# Patient Record
Sex: Female | Born: 1985 | Race: Black or African American | Hispanic: No | Marital: Single | Smoking: Never smoker
Health system: Southern US, Community
[De-identification: ages and names within clinical notes are randomized; demographics above are authoritative.]

## PROBLEM LIST (undated history)

## (undated) HISTORY — PX: EYE SURGERY: SHX253

---

## 2010-10-18 ENCOUNTER — Ambulatory Visit (HOSPITAL_COMMUNITY)
Admission: RE | Admit: 2010-10-18 | Discharge: 2010-10-18 | Disposition: A | Discharge status: Home / Self Care | Payer: BC Managed Care – PPO | Source: Ambulatory Visit | Attending: Emergency Medicine | Admitting: Emergency Medicine

## 2010-10-18 ENCOUNTER — Emergency Department (HOSPITAL_COMMUNITY)
Admission: EM | Admit: 2010-10-18 | Discharge: 2010-10-18 | Discharge status: Against Medical Advice | Payer: BC Managed Care – PPO | Attending: Emergency Medicine | Admitting: Emergency Medicine

## 2010-10-18 ENCOUNTER — Emergency Department (HOSPITAL_COMMUNITY): Payer: BC Managed Care – PPO

## 2010-10-18 ENCOUNTER — Encounter (HOSPITAL_COMMUNITY): Payer: Self-pay

## 2010-10-18 DIAGNOSIS — R072 Precordial pain: Secondary | ICD-10-CM | POA: Insufficient documentation

## 2010-10-18 DIAGNOSIS — R112 Nausea with vomiting, unspecified: Secondary | ICD-10-CM | POA: Insufficient documentation

## 2010-10-18 DIAGNOSIS — R0602 Shortness of breath: Secondary | ICD-10-CM | POA: Insufficient documentation

## 2010-10-18 LAB — DIFFERENTIAL
Lymphocytes Relative: 36 % (ref 12–46)
Lymphs Abs: 2.9 10*3/uL (ref 0.7–4.0)
Neutrophils Relative %: 57 % (ref 43–77)

## 2010-10-18 LAB — CBC
HCT: 29.3 % — ABNORMAL LOW (ref 36.0–46.0)
MCV: 73.1 fL — ABNORMAL LOW (ref 78.0–100.0)
Platelets: 314 10*3/uL (ref 150–400)
RBC: 4.01 MIL/uL (ref 3.87–5.11)
WBC: 8 10*3/uL (ref 4.0–10.5)

## 2010-10-18 LAB — COMPREHENSIVE METABOLIC PANEL
ALT: 10 U/L (ref 0–35)
Albumin: 3.3 g/dL — ABNORMAL LOW (ref 3.5–5.2)
Alkaline Phosphatase: 56 U/L (ref 39–117)
BUN: 12 mg/dL (ref 6–23)
Chloride: 102 mEq/L (ref 96–112)
Glucose, Bld: 106 mg/dL — ABNORMAL HIGH (ref 70–99)
Potassium: 3.7 mEq/L (ref 3.5–5.1)
Sodium: 137 mEq/L (ref 135–145)
Total Bilirubin: 0.1 mg/dL — ABNORMAL LOW (ref 0.3–1.2)

## 2010-10-18 LAB — RAPID URINE DRUG SCREEN, HOSP PERFORMED
Amphetamines: NOT DETECTED
Barbiturates: NOT DETECTED
Tetrahydrocannabinol: NOT DETECTED

## 2010-10-18 LAB — POCT PREGNANCY, URINE: Preg Test, Ur: NEGATIVE

## 2010-10-18 LAB — LIPASE, BLOOD: Lipase: 21 U/L (ref 11–59)

## 2010-10-18 MED ORDER — IOHEXOL 300 MG/ML  SOLN
100.0000 mL | Freq: Once | INTRAMUSCULAR | Status: AC | PRN
  Administered 2010-10-18: 100 mL via INTRAVENOUS

## 2013-02-12 IMAGING — CT CT ANGIO CHEST
2 of 7 series · 18 of 46 positions shown · IV contrast (APPLIED)
Comparison: Chest radiographs dated 10/18/2010

CLINICAL DATA: Vomiting, chest pain, elevated D-dimer, evaluate
for PE

CT ANGIOGRAPHY CHEST WITH CONTRAST
TECHNIQUE: Multidetector CT imaging of the chest was performed
using the standard protocol during bolus administration of
intravenous contrast.  Multiplanar CT image reconstructions
including MIPs were obtained to evaluate the vascular anatomy.
Contrast:  100 ml Wmnipaque-EMM IV

[Series 10: pulm embolism 1.0 b25f thin · axial · 0.71mm/px · z∈[-224,-15]mm · 15 of 239 slices shown]
[im 15/239  lung]
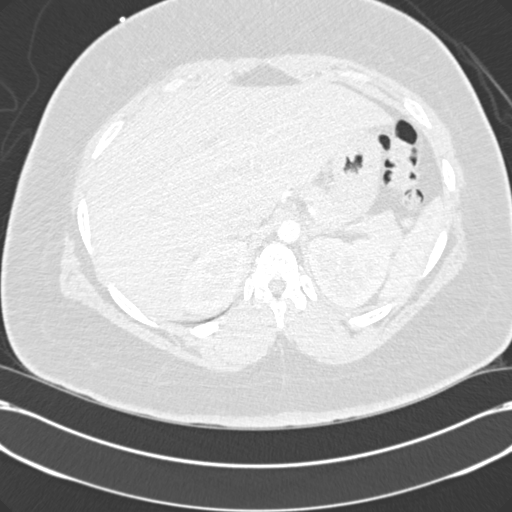
[im 30/239  soft-tissue]
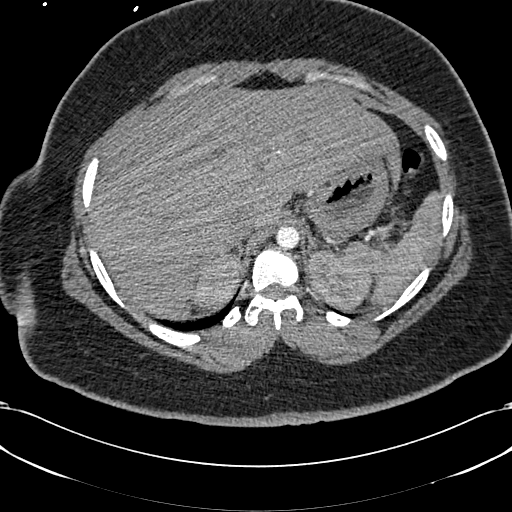
[im 45/239  lung]
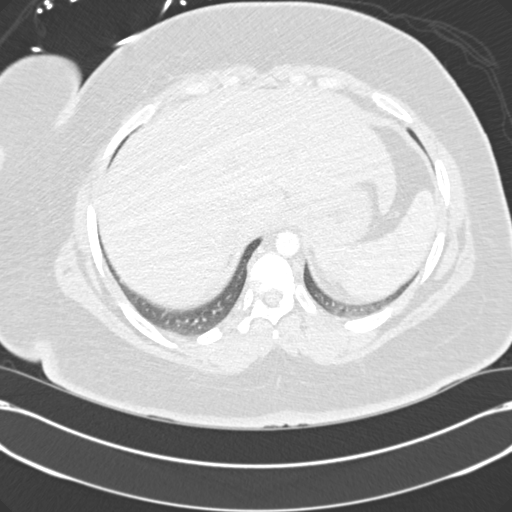
[im 60/239  soft-tissue]
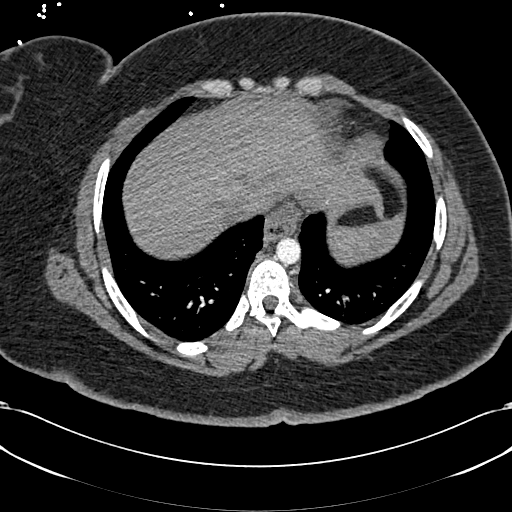
[im 75/239  lung]
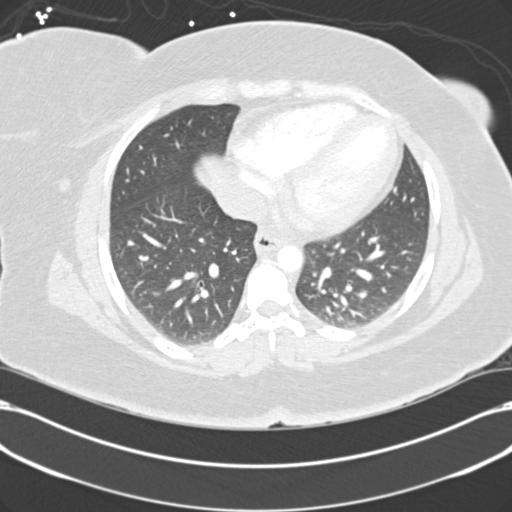
[im 90/239  soft-tissue]
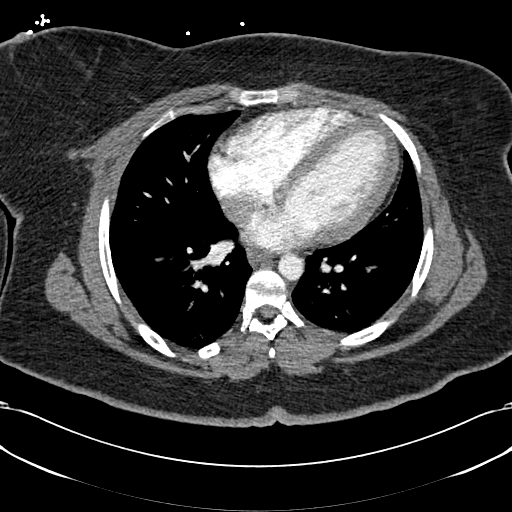
[im 105/239  lung]
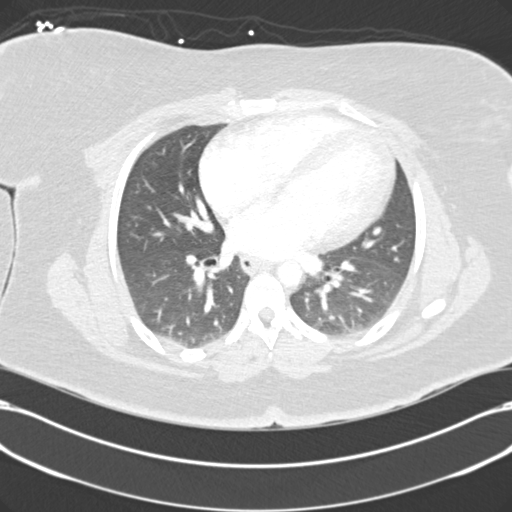
[im 120/239  soft-tissue]
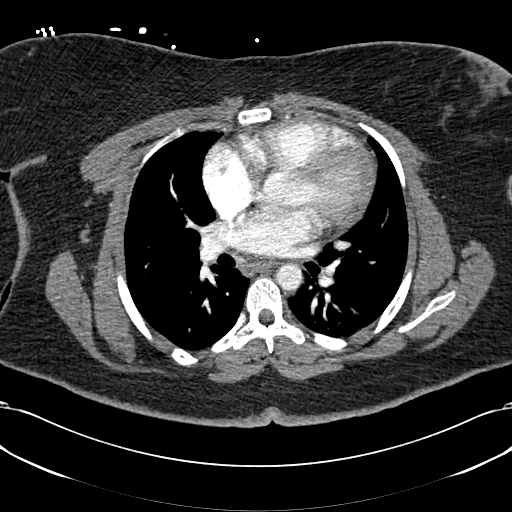
[im 134/239  lung]
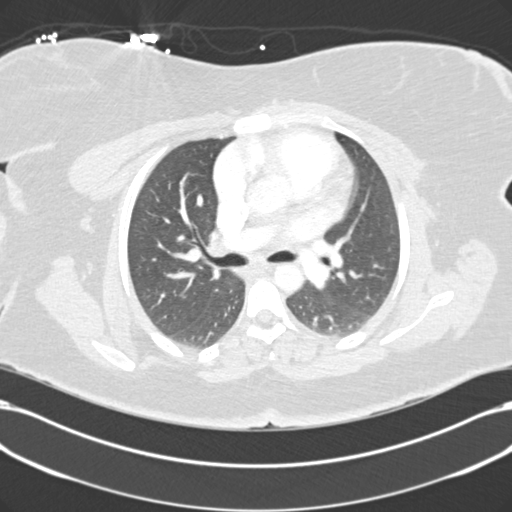
[im 149/239  soft-tissue]
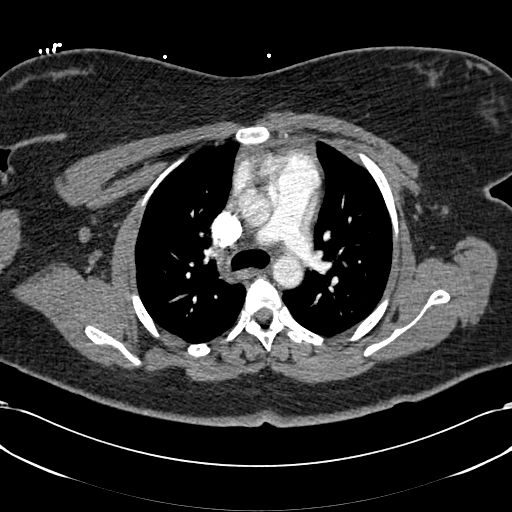
[im 164/239  lung]
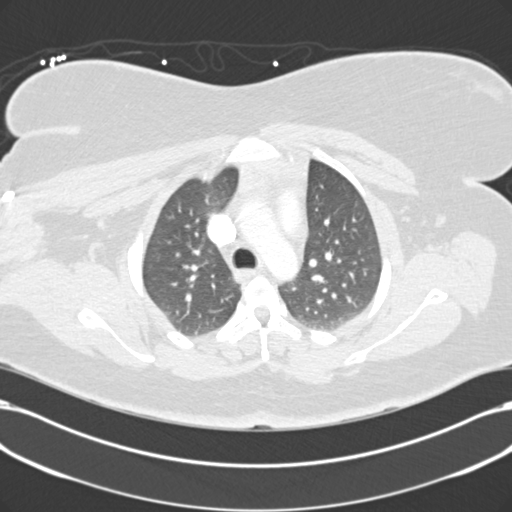
[im 179/239  soft-tissue]
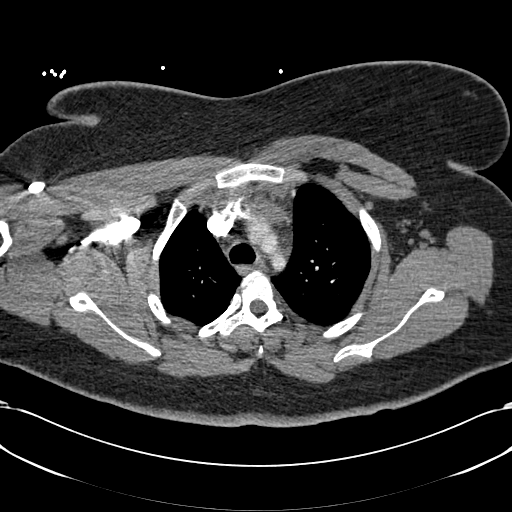
[im 194/239  lung]
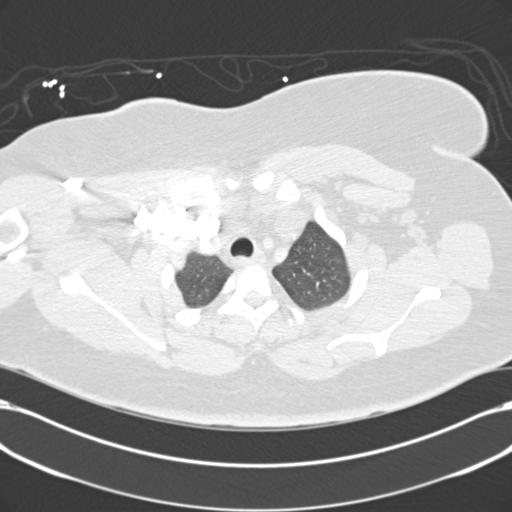
[im 209/239  soft-tissue]
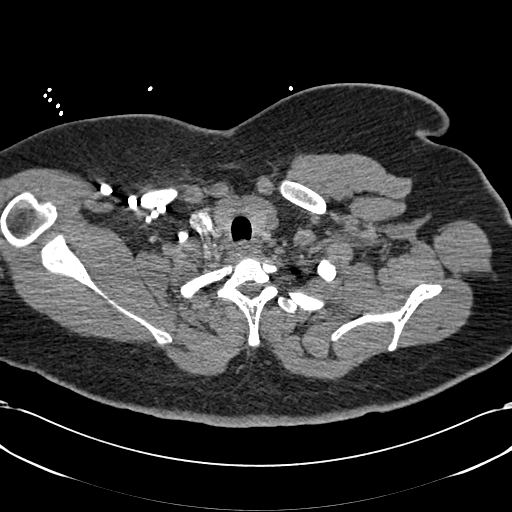
[im 224/239  lung]
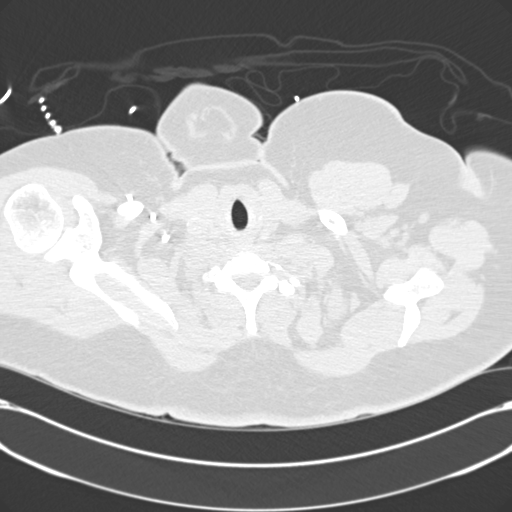

[Series 602: coronal mpr · coronal · 0.71mm/px · 3 of 72 slices shown]
[im 18/72  soft-tissue]
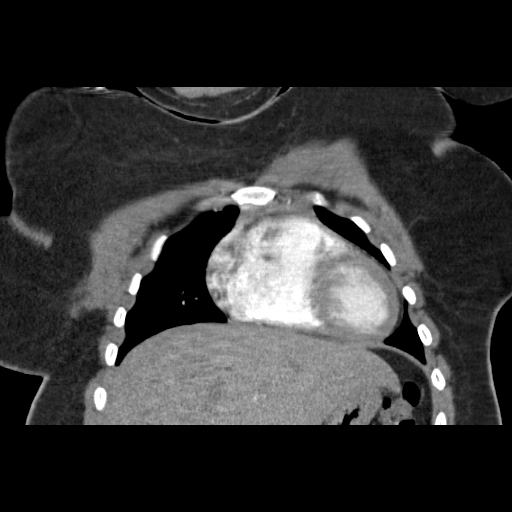
[im 36/72  soft-tissue]
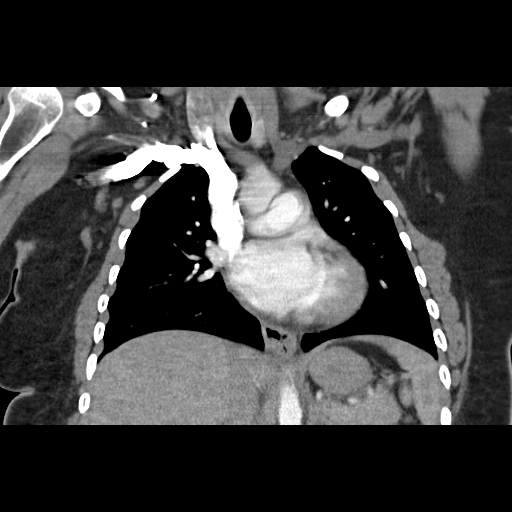
[im 54/72  soft-tissue]
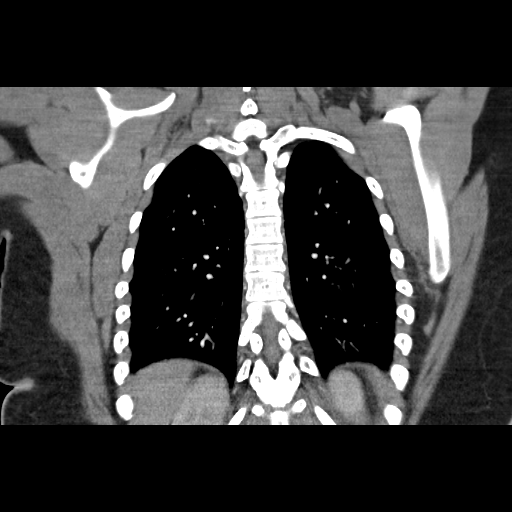

[18 of 46 positions shown; findings below may reference images not displayed]

FINDINGS: No evidence of pulmonary embolism.

Minimal dependent atelectasis in the left lower lobe.  No
suspicious pulmonary nodules.  No pleural effusion or pneumothorax.

Visualized thyroid is unremarkable.

Suspected residual thymic tissue in the anterior mediastinum
(series 6/image 37).

The heart is top normal in size/mildly enlarged.  No pericardial
effusion.

No suspicious mediastinal, hilar, or axillary lymphadenopathy.

Small hiatal hernia.

Visualized upper abdomen is unremarkable.

Visualized osseous structures are within normal limits.

Review of the MIP images confirms the above findings.
IMPRESSION: No evidence of pulmonary embolism.

## 2013-05-21 ENCOUNTER — Other Ambulatory Visit (HOSPITAL_COMMUNITY)
Admission: RE | Admit: 2013-05-21 | Discharge: 2013-05-21 | Disposition: A | Discharge status: Home / Self Care | Payer: BC Managed Care – PPO | Source: Ambulatory Visit | Attending: Family Medicine | Admitting: Family Medicine

## 2013-05-21 ENCOUNTER — Encounter: Payer: Self-pay | Admitting: Physician Assistant

## 2013-05-21 ENCOUNTER — Ambulatory Visit (INDEPENDENT_AMBULATORY_CARE_PROVIDER_SITE_OTHER): Payer: BC Managed Care – PPO | Admitting: Physician Assistant

## 2013-05-21 VITALS — BP 113/72 | HR 75 | Ht 62.0 in | Wt 222.0 lb

## 2013-05-21 DIAGNOSIS — Z131 Encounter for screening for diabetes mellitus: Secondary | ICD-10-CM

## 2013-05-21 DIAGNOSIS — Z113 Encounter for screening for infections with a predominantly sexual mode of transmission: Secondary | ICD-10-CM | POA: Insufficient documentation

## 2013-05-21 DIAGNOSIS — N760 Acute vaginitis: Secondary | ICD-10-CM | POA: Insufficient documentation

## 2013-05-21 DIAGNOSIS — N926 Irregular menstruation, unspecified: Secondary | ICD-10-CM | POA: Insufficient documentation

## 2013-05-21 DIAGNOSIS — Z309 Encounter for contraceptive management, unspecified: Secondary | ICD-10-CM

## 2013-05-21 DIAGNOSIS — Z01419 Encounter for gynecological examination (general) (routine) without abnormal findings: Secondary | ICD-10-CM

## 2013-05-21 DIAGNOSIS — Z124 Encounter for screening for malignant neoplasm of cervix: Secondary | ICD-10-CM | POA: Insufficient documentation

## 2013-05-21 DIAGNOSIS — L259 Unspecified contact dermatitis, unspecified cause: Secondary | ICD-10-CM

## 2013-05-21 DIAGNOSIS — L309 Dermatitis, unspecified: Secondary | ICD-10-CM | POA: Insufficient documentation

## 2013-05-21 DIAGNOSIS — Z13220 Encounter for screening for lipoid disorders: Secondary | ICD-10-CM

## 2013-05-21 MED ORDER — DESONIDE 0.05 % EX LOTN: 1.0000 "application " | TOPICAL_LOTION | Freq: Two times a day (BID) | CUTANEOUS | Status: AC

## 2013-05-21 MED ORDER — NORGESTIM-ETH ESTRAD TRIPHASIC 0.18/0.215/0.25 MG-25 MCG PO TABS: 1.0000 | ORAL_TABLET | Freq: Every day | ORAL | Status: AC

## 2013-05-21 MED ORDER — TRIAMCINOLONE ACETONIDE 0.1 % EX CREA: 1.0000 "application " | TOPICAL_CREAM | Freq: Two times a day (BID) | CUTANEOUS | Status: AC

## 2013-05-21 NOTE — Progress Notes (Signed)
Subjective:    Patient ID: Caitlin Frazier, female    DOB: 04-Aug-1985, 28 y.o.   MRN: [id number]  HPI Pt is a 28 yo female who presents to the clinic to establish care and get medication refills. [place] lived in [place] and all of [place] doctors were in [place]. She would like to get back on birth control today. She would like to know her options. She admits to not having regular periods. She is sexually active with one partner for 4 years and does not use condoms. In past used ortho tri cyclen. Never had a pap smear.   She would like refill on medications for eczema.   . Active Ambulatory Problems    Diagnosis Date Noted  . Eczema 05/21/2013  . Irregular menstrual cycle 05/21/2013   Resolved Ambulatory Problems    Diagnosis Date Noted  . No Resolved Ambulatory Problems   No Additional Past Medical History   . History   Social History  . Marital Status: Single    Spouse Name: N/A    Number of Children: N/A  . Years of Education: N/A   Occupational History  . Not on file.   Social History Main Topics  . Smoking status: Never Smoker   . Smokeless tobacco: Not on file  . Alcohol Use: 3.6 - 4.2 oz/week    6-7 Glasses of wine per week  . Drug Use: No  . Sexual Activity: Yes   Other Topics Concern  . Not on file   Social History Narrative  . No narrative on file   . Family History  Problem Relation Age of Onset  . Hypertension Father   . Cancer Maternal Aunt   . Diabetes Maternal Aunt   . Hyperlipidemia Maternal Aunt   . Hypertension Maternal Aunt   . Diabetes Maternal Uncle   . Hyperlipidemia Maternal Uncle   . Hypertension Maternal Uncle   . Sickle cell anemia Paternal Aunt   . Hypertension Paternal Uncle   . Sickle cell anemia Paternal Uncle   . Cancer Maternal Grandmother   . Diabetes Maternal Grandmother   . Hypertension Maternal Grandmother   . Cancer Maternal Grandfather   . Diabetes Maternal Grandfather   . Hypertension Maternal  Grandfather   . Hypertension Paternal Grandmother   . Cancer Paternal Grandfather   . Lupus Paternal Aunt   . Sickle cell anemia Paternal Aunt   . Sickle cell anemia Paternal Uncle       Review of Systems  All other systems reviewed and are negative.      Objective:   Physical Exam  Constitutional: She is oriented to person, place, and time. She appears well-developed and well-nourished.  HENT:  Head: Normocephalic and atraumatic.  Cardiovascular: Normal rate, regular rhythm and normal heart sounds.   Pulmonary/Chest: Effort normal and breath sounds normal. She has no wheezes.  Genitourinary: Vagina normal and uterus normal. No vaginal discharge found.  No adnexal tenderness. External vagina without lesions or masses. Cervical os pale with no polyps.   Musculoskeletal: Normal range of motion.  Neurological: She is alert and oriented to person, place, and time.  Psychiatric: She has a normal mood and affect. Her behavior is normal.          Assessment & Plan:  Contraception/screening pap/irregular periods- pap done today. Added STD panel. Discussed condom for STD protection. Discussed other birth control options nuva ring, depo shot, IUD, implanon, patch. Pt wanted to try pill again. Start on ortho tri cyclen  low today. Discussed SE and risk. Pt given HO.   Eczema- refilled desonide and triamcinolone.    Screening lab slip given. Discussed with patient needs some labs done. Discussed Tdap. Pt thinks she got at previous provider. Will compare with records when we receive.

## 2013-05-21 NOTE — Patient Instructions (Addendum)
Oral Contraception Information Oral contraceptive pills (OCPs) are medicines taken to prevent pregnancy. OCPs work by preventing the ovaries from releasing eggs. The hormones in OCPs also cause the cervical mucus to thicken, preventing the sperm from entering the uterus. The hormones also cause the uterine lining to become thin, not allowing a fertilized egg to attach to the inside of the uterus. OCPs are highly effective when taken exactly as prescribed. However, OCPs do not prevent sexually transmitted diseases (STDs). Safe sex practices, such as using condoms along with the pill, can help prevent STDs.  Before taking the pill, you may have a physical exam and Pap test. Your health care provider may order blood tests. The health care provider will make sure you are a good candidate for oral contraception. Discuss with your health care provider the possible side effects of the OCP you may be prescribed. When starting an OCP, it can take 2 to 3 months for the body to adjust to the changes in hormone levels in your body.  TYPES OF ORAL CONTRACEPTION  The combination pill This pill contains estrogen and progestin (synthetic progesterone) hormones. The combination pill comes in 21-day, 28-day, or 91-day packs. Some types of combination pills are meant to be taken continuously (365-day pills). With 21-day packs, you do not take pills for 7 days after the last pill. With 28-day packs, the pill is taken every day. The last 7 pills are without hormones. Certain types of pills have more than 21 hormone-containing pills. With 91-day packs, the first 84 pills contain both hormones, and the last 7 pills contain no hormones or contain estrogen only.  The minipill This pill contains the progesterone hormone only. The pill is taken every day continuously. It is very important to take the pill at the same time each day. The minipill comes in packs of 28 pills. All 28 pills contain the hormone.  ADVANTAGES OF ORAL  CONTRACEPTIVE PILLS  Decreases premenstrual symptoms.   Treats menstrual period cramps.   Regulates the menstrual cycle.   Decreases a heavy menstrual flow.   May treatacne, depending on the type of pill.   Treats abnormal uterine bleeding.   Treats polycystic ovarian syndrome.   Treats endometriosis.   Can be used as emergency contraception.  THINGS THAT CAN MAKE ORAL CONTRACEPTIVE PILLS LESS EFFECTIVE OCPs can be less effective if:   You forget to take the pill at the same time every day.   You have a stomach or intestinal disease that lessens the absorption of the pill.   You take OCPs with other medicines that make OCPs less effective, such as antibiotics, certain HIV medicines, and some seizure medicines.   You take expired OCPs.   You forget to restart the pill on day 7, when using the packs of 21 pills.  RISKS ASSOCIATED WITH ORAL CONTRACEPTIVE PILLS  Oral contraceptive pills can sometimes cause side effects, such as:  Headache.  Nausea.  Breast tenderness.  Irregular bleeding or spotting. Combination pills are also associated with a small increased risk of:  Blood clots.  Heart attack.  Stroke. Document Released: 04/13/2002 Document Revised: 11/11/2012 Document Reviewed: 07/12/2012 [facility] Patient Information 2014 [place], [place]. Ethinyl Estradiol; Norethindrone tablets What is this medicine? ETHINYL ESTRADIOL; NORETHINDRONE (ETH in il es tra DYE ole; nor eth IN drone) is an oral contraceptive. The products combine two types of female hormones, an estrogen and a progestin. They are used to prevent ovulation and pregnancy. This medicine may be used for other purposes;  ask your health care provider or pharmacist if you have questions. COMMON BRAND NAME(S): Rica MastLYACEN, Aranelle, Balziva, Brevicon, BRIELLYN, Cyclafem 1/35, Cyclafem 7/7/7, DASETTA, [place]ia, [place], Mod[place], Necon 0.5/35, Necon 1/35, Necon 10/11, Necon 7/7/7, Norinyl 1/35, Nortrel  0.5/35 , Nortrel 1/35, Nortrel 7/7/7 , Ortho-Novum 1/35, Ortho-Novum 10/11, Ortho-Novum 7/7/7, Ovcon 35, Ovcon 50, PHILITH, Pirmella, Tri-Norinyl, Vyfemla, WERA, Zenc[place]t  What should I tell my health care provider before I take this medicine? They need to know if you have or ever had any of these conditions: -abnormal vaginal bleeding -blood vessel disease or blood clots -breast, cervical, endometrial, ovarian, liver, or uterine cancer -diabetes -gallbladder disease -heart disease or recent heart attack -high blood pressure -high cholesterol -kidney disease -liver disease -migraine headaches -stroke -systemic lupus erythematosus (SLE) -tobacco smoker -an unusual or allergic reaction to estrogens, progestins, other medicines, foods, dyes, or preservatives -pregnant or trying to get pregnant -breast-feeding How should I use this medicine? Take this medicine by mouth. To reduce nausea, this medicine may be taken with food. Follow the directions on the prescription label. Take this medicine at the same time each day and in the order directed on the package. Do not take your medicine more often than directed. A patient package insert for the product will be given with each prescription and refill. Read this sheet carefully each time. The sheet may change frequently. Contact your pediatrician regarding the use of this medicine in children. Special care may be needed. This medicine has been used in female children who have started having menstrual periods. Overdosage: If you think you have taken too much of this medicine contact a poison control center or emergency room at once. NOTE: This medicine is only for you. Do not share this medicine with others. What if I miss a dose? If you miss a dose, refer to the patient information sheet you received with your medicine for direction. If you miss more than one pill, this medicine may not be as effective and you may need to use another form of birth  control. What may interact with this medicine? -acetaminophen -antibiotics or medicines for infections, especially rifampin, rifabutin, rifapentine, and griseofulvin, and possibly penicillins or tetracyclines -aprepitant -ascorbic acid (vitamin C) -atorvastatin -barbiturate medicines, such as phenobarbital -bosentan -carbamazepine -caffeine -clofibrate -cyclosporine -dantrolene -doxercalciferol -felbamate -grapefruit juice -hydrocortisone -medicines for anxiety or sleeping problems, such as diazepam or temazepam -medicines for diabetes, including pioglitazone -mineral oil -modafinil -mycophenolate -nefazodone -oxcarbazepine -phenytoin -prednisolone -ritonavir or other medicines for HIV infection or AIDS -rosuvastatin -selegiline -soy isoflavones supplements -St. John's wort -tamoxifen or raloxifene -theophylline -thyroid hormones -topiramate -warfarin This list may not describe all possible interactions. Give your health care provider a list of all the medicines, herbs, non-prescription drugs, or dietary supplements you use. Also tell them if you smoke, drink alcohol, or use illegal drugs. Some items may interact with your medicine. What should I watch for while using this medicine? Visit your doctor or health care professional for regular checks on your progress. You will need a regular breast and pelvic exam and Pap smear while on this medicine. Use an additional method of contraception during the first cycle that you take these tablets. If you have any reason to think you are pregnant, stop taking this medicine right away and contact your doctor or health care professional. If you are taking this medicine for hormone related problems, it may take several cycles of use to see improvement in your condition. Smoking increases the risk of getting a blood clot  or having a stroke while you are taking birth control pills, especially if you are more than 26 years old. You are  strongly advised not to smoke. This medicine can make your body retain fluid, making your fingers, hands, or ankles swell. Your blood pressure can go up. Contact your doctor or health care professional if you feel you are retaining fluid. This medicine can make you more sensitive to the sun. Keep out of the sun. If you cannot avoid being in the sun, wear protective clothing and use sunscreen. Do not use sun lamps or tanning beds/booths. If you wear contact lenses and notice visual changes, or if the lenses begin to feel uncomfortable, consult your eye care specialist. In some women, tenderness, swelling, or minor bleeding of the gums may occur. Notify your dentist if this happens. Brushing and flossing your teeth regularly may help limit this. See your dentist regularly and inform your dentist of the medicines you are taking. If you are going to have elective surgery, you may need to stop taking this medicine before the surgery. Consult your health care professional for advice. This medicine does not protect you against HIV infection (AIDS) or any other sexually transmitted diseases. What side effects may I notice from receiving this medicine? Side effects that you should report to your doctor or health care professional as soon as possible: -allergic reactions like skin rash, itching or hives, swelling of the face, lips, or tongue -breast tissue changes or discharge -changes in vaginal bleeding during your period or between your periods -chest pain -coughing up blood -dizziness or fainting spells -headaches or migraines -leg, arm or groin pain -problems with balance, talking, walking -severe or sudden headaches -severe stomach pain -sudden shortness of breath -symptoms of vaginal infection like itching, irritation or unusual discharge -tenderness in the upper abdomen -vomiting -weakness or numbness in the arms or legs, especially on one side of the body -yellowing of the eyes or skin Side  effects that usually do not require medical attention (report to your doctor or health care professional if they continue or are bothersome): -breakthrough bleeding and spotting that continues beyond the 3 initial cycles of pills -breast tenderness -mood changes, anxiety, depression, frustration, anger, or emotional outbursts -increased sensitivity to sun or ultraviolet light -nausea -skin rash, acne, or brown spots on the skin -slight weight gain This list may not describe all possible side effects. Call your doctor for medical advice about side effects. You may report side effects to FDA at 1-800-FDA-1088. Where should I keep my medicine? Keep out of the reach of children. Store at room temperature between 15 and 30 degrees C (59 and 86 degrees F). Throw away any unused medicine after the expiration date. NOTE: This sheet is a summary. It may not cover all possible information. If you have questions about this medicine, talk to your doctor, pharmacist, or health care provider.  2014, [employer]. (2008-01-07 13:29:07)

## 2013-05-24 LAB — CERVICOVAGINAL ANCILLARY ONLY
Bacterial vaginitis: POSITIVE — AB
CANDIDA VAGINITIS: NEGATIVE

## 2013-05-25 ENCOUNTER — Other Ambulatory Visit: Payer: Self-pay | Admitting: Physician Assistant

## 2013-05-25 MED ORDER — METRONIDAZOLE 500 MG PO TABS: 500.0000 mg | ORAL_TABLET | Freq: Two times a day (BID) | ORAL | Status: AC

## 2013-05-27 LAB — CERVICOVAGINAL ANCILLARY ONLY: HERPES (WINDOWPATH): NEGATIVE
